# Patient Record
Sex: Male | Born: 1985 | Race: White | Hispanic: No | Marital: Married | Smoking: Former smoker
Health system: Southern US, Community
[De-identification: ages and names within clinical notes are randomized; demographics above are authoritative.]

## PROBLEM LIST (undated history)

## (undated) DIAGNOSIS — S060X9A Concussion with loss of consciousness of unspecified duration, initial encounter: Secondary | ICD-10-CM

## (undated) DIAGNOSIS — S060XAA Concussion with loss of consciousness status unknown, initial encounter: Secondary | ICD-10-CM

## (undated) DIAGNOSIS — F909 Attention-deficit hyperactivity disorder, unspecified type: Secondary | ICD-10-CM

## (undated) HISTORY — PX: KNEE SURGERY: SHX244

## (undated) HISTORY — DX: Concussion with loss of consciousness status unknown, initial encounter: S06.0XAA

## (undated) HISTORY — DX: Attention-deficit hyperactivity disorder, unspecified type: F90.9

## (undated) HISTORY — DX: Concussion with loss of consciousness of unspecified duration, initial encounter: S06.0X9A

---

## 2019-06-26 ENCOUNTER — Ambulatory Visit: Payer: Self-pay | Attending: Internal Medicine

## 2019-06-26 DIAGNOSIS — Z23 Encounter for immunization: Secondary | ICD-10-CM

## 2019-06-26 NOTE — Progress Notes (Signed)
   Covid-19 Vaccination Clinic  Name:  Scott Frederick    MRN: [id number] DOB: 11/04/1985  06/26/2019  Scott Frederick was observed post Covid-19 immunization for 15 minutes without incident. He was provided with Vaccine Information Sheet and instruction to access the V-Safe system.   Scott Frederick was instructed to call 911 with any severe reactions post vaccine: Marland Kitchen Difficulty breathing  . Swelling of face and throat  . A fast heartbeat  . A bad rash all over body  . Dizziness and weakness   Immunizations Administered    Name Date Dose VIS Date Route   Pfizer COVID-19 Vaccine 06/26/2019  3:07 PM 0.3 mL 02/20/2019 Intramuscular   Manufacturer: [employer], [employer]   Lot: [id number]   NDC: [address]-4

## 2019-07-20 ENCOUNTER — Ambulatory Visit: Payer: Self-pay | Attending: Internal Medicine

## 2019-07-21 ENCOUNTER — Ambulatory Visit: Payer: Self-pay | Attending: Internal Medicine

## 2019-07-21 DIAGNOSIS — Z23 Encounter for immunization: Secondary | ICD-10-CM

## 2019-07-21 NOTE — Progress Notes (Signed)
   Covid-19 Vaccination Clinic  Name:  Scott Frederick    MRN: [id number] DOB: [birth year]  07/21/2019  Scott Frederick was observed post Covid-19 immunization for 15 minutes without incident. He was provided with Vaccine Information Sheet and instruction to access the V-Safe system.   Scott Frederick was instructed to call 911 with any severe reactions post vaccine: Marland Kitchen Difficulty breathing  . Swelling of face and throat  . A fast heartbeat  . A bad rash all over body  . Dizziness and weakness   Immunizations Administered    Name Date Dose VIS Date Route   Pfizer COVID-19 Vaccine 07/21/2019  4:00 PM 0.3 mL 05/06/2018 Intramuscular   Manufacturer: [employer], [employer]   Lot: [id number]   NDC: [address]-8

## 2020-01-03 ENCOUNTER — Other Ambulatory Visit: Payer: Self-pay

## 2020-01-03 ENCOUNTER — Emergency Department (HOSPITAL_COMMUNITY)
Admission: EM | Admit: 2020-01-03 | Discharge: 2020-01-03 | Disposition: A | Discharge status: Home / Self Care | Payer: BC Managed Care – PPO | Attending: Emergency Medicine | Admitting: Emergency Medicine

## 2020-01-03 ENCOUNTER — Emergency Department (HOSPITAL_COMMUNITY): Payer: BC Managed Care – PPO

## 2020-01-03 ENCOUNTER — Encounter (HOSPITAL_COMMUNITY): Payer: Self-pay | Admitting: Emergency Medicine

## 2020-01-03 DIAGNOSIS — R9082 White matter disease, unspecified: Secondary | ICD-10-CM | POA: Diagnosis not present

## 2020-01-03 DIAGNOSIS — R202 Paresthesia of skin: Secondary | ICD-10-CM | POA: Insufficient documentation

## 2020-01-03 DIAGNOSIS — R200 Anesthesia of skin: Secondary | ICD-10-CM

## 2020-01-03 LAB — COMPREHENSIVE METABOLIC PANEL
ALT: 25 U/L (ref 0–44)
AST: 21 U/L (ref 15–41)
Albumin: 4.5 g/dL (ref 3.5–5.0)
Alkaline Phosphatase: 66 U/L (ref 38–126)
Anion gap: 9 (ref 5–15)
BUN: 12 mg/dL (ref 6–20)
CO2: 28 mmol/L (ref 22–32)
Calcium: 9.7 mg/dL (ref 8.9–10.3)
Chloride: 103 mmol/L (ref 98–111)
Creatinine, Ser: 1.22 mg/dL (ref 0.61–1.24)
GFR, Estimated: >60 mL/min (ref >60)
Glucose, Bld: 170 mg/dL — ABNORMAL HIGH (ref 70–99)
Potassium: 3.9 mmol/L (ref 3.5–5.1)
Sodium: 140 mmol/L (ref 135–145)
Total Bilirubin: 1 mg/dL (ref 0.3–1.2)
Total Protein: 7.4 g/dL (ref 6.5–8.1)

## 2020-01-03 LAB — CBC WITH DIFFERENTIAL/PLATELET
Abs Immature Granulocytes: 0.02 10*3/uL (ref 0.00–0.07)
Basophils Absolute: 0 10*3/uL (ref 0.0–0.1)
Basophils Relative: 1 %
Eosinophils Absolute: 0.1 10*3/uL (ref 0.0–0.5)
Eosinophils Relative: 2 %
HCT: 48.2 % (ref 39.0–52.0)
Hemoglobin: 16.2 g/dL (ref 13.0–17.0)
Immature Granulocytes: 0 %
Lymphocytes Relative: 30 %
Lymphs Abs: 1.8 10*3/uL (ref 0.7–4.0)
MCH: 29.5 pg (ref 26.0–34.0)
MCHC: 33.6 g/dL (ref 30.0–36.0)
MCV: 87.6 fL (ref 80.0–100.0)
Monocytes Absolute: 0.3 10*3/uL (ref 0.1–1.0)
Monocytes Relative: 5 %
Neutro Abs: 3.8 10*3/uL (ref 1.7–7.7)
Neutrophils Relative %: 62 %
Platelets: 207 10*3/uL (ref 150–400)
RBC: 5.5 MIL/uL (ref 4.22–5.81)
RDW: 11.9 % (ref 11.5–15.5)
WBC: 6 10*3/uL (ref 4.0–10.5)
nRBC: 0 % (ref 0.0–0.2)

## 2020-01-03 LAB — TSH: TSH: 1.058 u[IU]/mL (ref 0.350–4.500)

## 2020-01-03 LAB — MAGNESIUM: Magnesium: 2 mg/dL (ref 1.7–2.4)

## 2020-01-03 LAB — CK: Total CK: 95 U/L (ref 49–397)

## 2020-01-03 MED ORDER — GADOBUTROL 1 MMOL/ML IV SOLN
10.0000 mL | Freq: Once | INTRAVENOUS | Status: AC | PRN
  Administered 2020-01-03: 10 mL via INTRAVENOUS

## 2020-01-03 NOTE — Discharge Instructions (Signed)
You were seen in the emergency room today with intermittent numbness in the left arm and bilateral legs.  Really did lab work and an MRI of the brain which did not show any active lesions that would point to any specific cause of your symptoms.  I have placed a referral to a neurologist and would like for you to call the office tomorrow to arrange the first available follow-up appointment.  Please also call your primary care doctor to schedule a follow-up appointment.  Return to the emergency department any new or suddenly worsening symptoms such as chest pain, worsening numbness, weakness, vision changes, sudden severe headache, or speech changes.

## 2020-01-03 NOTE — ED Notes (Signed)
Pt returns from MRI. Pt has complaints at this time.

## 2020-01-03 NOTE — ED Triage Notes (Signed)
Pt. Stated, this numbness in my left arm and both legs feel like Ive been laying on them, with some occassionally pain. This all started on Friday.

## 2020-01-03 NOTE — ED Provider Notes (Signed)
Emergency Department Provider Note   I have reviewed the triage vital signs and the nursing notes.   HISTORY  Chief Complaint Arm Pain and Leg Pain   HPI Scott Frederick is a 34 y.o. male with no known past medical history presents to the emergency department with intermittent numbness feeling in the extremities.  Patient states that several weeks ago he had numbness in his bilateral arms along with some chest heaviness.  He went to an outside emergency department and had a cardiac work-up which was negative.  Symptoms resolved and had not returned until 2 days ago when he began noticing some numb type feeling in his left upper extremity.  He states that he intermittently has a numbness feeling in the bilateral legs which seems localized in the calfs and thighs but somewhat difficult to fully localize.  He does occasionally have numb feeling in the right arm.  The left arm numbness has been more persistent.  He denies vision change, headache, neck or back pain, speech difficulty, difficulty swallowing.  He does report a family history of factor V Leiden but this was a recent diagnosis for his father and he himself has not been tested.  He is not experiencing any chest pain with this episode, palpitations, shortness of breath.  No upper respiratory infection symptoms. No specific weakness. He drinks EtOH occasionally and denies drug use.    History reviewed. No pertinent past medical history.  There are no problems to display for this patient.   History reviewed. No pertinent surgical history.  Allergies Patient has no allergy information on record.  No family history on file.  Social History Social History   Tobacco Use  . Smoking status: Not on file  Substance Use Topics  . Alcohol use: Not on file  . Drug use: Not on file    Review of Systems  Constitutional: No fever/chills Eyes: No visual changes. ENT: No sore throat. Cardiovascular: Denies chest pain. Respiratory:  Denies shortness of breath. Gastrointestinal: No abdominal pain.  No nausea, no vomiting.  No diarrhea.  No constipation. Genitourinary: Negative for dysuria. Musculoskeletal: Negative for back pain. Skin: Negative for rash. Neurological: Negative for headaches or weakness. Positive numbness in the extremities.   10-point ROS otherwise negative.  ____________________________________________   PHYSICAL EXAM:  VITAL SIGNS: ED Triage Vitals  Enc Vitals Group     BP 01/03/20 1049 (!) 156/87     Pulse Rate 01/03/20 1049 (!) 106     Resp 01/03/20 1049 17     Temp 01/03/20 1049 98.7 F (37.1 C)     Temp Source 01/03/20 1049 Oral     SpO2 01/03/20 1049 100 %   Constitutional: Alert and oriented. Well appearing and in no acute distress. Eyes: Conjunctivae are normal. PERRL. EOMI. Head: Atraumatic. Nose: No congestion/rhinnorhea. Mouth/Throat: Mucous membranes are moist.  Neck: No stridor.   Cardiovascular: Tachycardia. Good peripheral circulation. Grossly normal heart sounds.   Respiratory: Normal respiratory effort.  No retractions. Lungs CTAB. Gastrointestinal: Soft and nontender. No distention.  Musculoskeletal: No lower extremity tenderness nor edema. No gross deformities of extremities. Neurologic:  Normal speech and language.  No facial asymmetry.  Normal sensation in the bilateral face.  Subjective decreased sensation to light touch in the left arm but preserved 5/5 strength in the bilateral upper and lower extremities with no drift.  No sensory deficit in the bilateral lower extremities appreciated.  Skin:  Skin is warm, dry and intact. No rash noted.  ____________________________________________  LABS (all labs ordered are listed, but only abnormal results are displayed)  Labs Reviewed  COMPREHENSIVE METABOLIC PANEL - Abnormal; Notable for the following components:      Result Value   Glucose, Bld 170 (*)    All other components within normal limits  CBC WITH  DIFFERENTIAL/PLATELET  CK  MAGNESIUM  TSH   ____________________________________________  EKG   EKG Interpretation  Date/Time:  Sunday January 03 2020 11:34:27 EDT Ventricular Rate:  97 PR Interval:    QRS Duration: 90 QT Interval:  322 QTC Calculation: 409 R Axis:   84 Text Interpretation: Sinus rhythm Consider anterior infarct No STEMI Confirmed by Alona Bene [phone]) on 01/03/2020 11:43:22 AM       ____________________________________________  RADIOLOGY  MR Brain W and Wo Contrast  Result Date: 01/03/2020 CLINICAL DATA:  "Multiple sclerosis, new event." Numbness in the left arm and both legs with occasional pain that started on Friday. EXAM: MRI HEAD WITHOUT AND WITH CONTRAST TECHNIQUE: Multiplanar, multiecho pulse sequences of the brain and surrounding structures were obtained without and with intravenous contrast. CONTRAST:  69mL GADAVIST GADOBUTROL 1 MMOL/ML IV SOLN COMPARISON:  None. FINDINGS: Brain: No acute infarction, hemorrhage, hydrocephalus, extra-axial collection or mass lesion. No white matter disease. Incidental mega cisterna magna. Vascular: Normal flow voids and vascular enhancements Skull and upper cervical spine: Normal marrow signal Sinuses/Orbits: Minor mucosal thickening that is generalized in the paranasal sinuses. IMPRESSION: Normal MRI of the brain. Electronically Signed   By: Marnee Spring M.D.   On: 01/03/2020 13:45    ____________________________________________   PROCEDURES  Procedure(s) performed:   Procedures  None  ____________________________________________   INITIAL IMPRESSION / ASSESSMENT AND PLAN / ED COURSE  Pertinent labs & imaging results that were available during my care of the patient were reviewed by me and considered in my medical decision making (see chart for details).   Patient presents to the emergency department for evaluation of intermittent numbness in the extremities.  Numbness is most persistent in the left  upper extremity and there is a sensory difference in the right and left arms on my exam.  No weakness, cranial nerve findings.  Patient's not having headache to suspect complex migraine.  No neck or back pain to suspect more radicular type pathology.  MS is a consideration.  Patient is low risk for stroke.  Have added on TSH, mag, CK in addition to basic lab work.  He is not having chest pain to suspect acute ACS or PE. Discussed case with Neurology on call who agree with MRI brain w and w/o contrast to r/o CVA vs MS lesions.   01:50 PM  MRI brain is negative for CVA or other lesions to explain symptoms. Have placed a Neurology referral in Epic and advised close PCP and Neuro follow up. Discussed ED return precautions. Labs reviewed and unremarkable.    ____________________________________________  FINAL CLINICAL IMPRESSION(S) / ED DIAGNOSES  Final diagnoses:  Numbness and tingling in left arm  Numbness and tingling of both legs    MEDICATIONS GIVEN DURING THIS VISIT:  Medications  gadobutrol (GADAVIST) 1 MMOL/ML injection 10 mL (10 mLs Intravenous Contrast Given 01/03/20 1300)    Note:  This document was prepared using Dragon voice recognition software and may include unintentional dictation errors.  Alona Bene, MD, [facility]    Chaniyah Jahr, Arlyss Repress, MD 01/03/20 1356

## 2020-01-03 NOTE — ED Notes (Signed)
Patient transported to MRI 
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
## 2020-01-06 ENCOUNTER — Ambulatory Visit: Payer: BC Managed Care – PPO | Admitting: Neurology

## 2020-01-06 ENCOUNTER — Other Ambulatory Visit: Payer: Self-pay

## 2020-01-06 ENCOUNTER — Encounter: Payer: Self-pay | Admitting: Neurology

## 2020-01-06 VITALS — BP 150/92 | HR 64 | Ht 71.0 in | Wt 243.0 lb

## 2020-01-06 DIAGNOSIS — R202 Paresthesia of skin: Secondary | ICD-10-CM

## 2020-01-06 DIAGNOSIS — R200 Anesthesia of skin: Secondary | ICD-10-CM

## 2020-01-06 NOTE — Patient Instructions (Addendum)
EMG/NCS MRI cervical spine   Carpal Tunnel Syndrome  Carpal tunnel syndrome is a condition that causes pain in your hand and arm. The carpal tunnel is a narrow area that is on the palm side of your wrist. Repeated wrist motion or certain diseases may cause swelling in the tunnel. This swelling can pinch the main nerve in the wrist (median nerve). What are the causes? This condition may be caused by:  Repeated wrist motions.  Wrist injuries.  Arthritis.  A sac of fluid (cyst) or abnormal growth (tumor) in the carpal tunnel.  Fluid buildup during pregnancy. Sometimes the cause is not known. What increases the risk? The following factors may make you more likely to develop this condition:  Having a job in which you move your wrist in the same way many times. This includes jobs like being a Midwife or a Conservation officer, nature.  Having other health conditions, such as: ? Diabetes. ? Obesity. ? A thyroid gland that is not active enough (hypothyroidism). ? Kidney failure. What are the signs or symptoms? Symptoms of this condition include:  A tingling feeling in your fingers.  Tingling or a loss of feeling (numbness) in your hand.  Pain in your entire arm. This pain may get worse when you bend your wrist and elbow for a long time.  Pain in your wrist that goes up your arm to your shoulder.  Pain that goes down into your palm or fingers.  A weak feeling in your hands. You may find it hard to grab and hold items. You may feel worse at night. How is this diagnosed? This condition is diagnosed with a medical history and physical exam. You may also have tests, such as:  Electromyogram (EMG). This test checks the signals that the nerves send to the muscles.  Nerve conduction study. This test checks how well signals pass through your nerves.  Imaging tests, such as X-rays, ultrasound, and MRI. These tests check for what might be the cause of your condition. How is this treated? This condition  may be treated with:  Lifestyle changes. You will be asked to stop or change the activity that caused your problem.  Doing exercise and activities that make bones and muscles stronger (physical therapy).  Learning how to use your hand again (occupational therapy).  Medicines for pain and swelling (inflammation). You may have injections in your wrist.  A wrist splint.  Surgery. Follow these instructions at home: If you have a splint:  Wear the splint as told by your doctor. Remove it only as told by your doctor.  Loosen the splint if your fingers: ? Tingle. ? Lose feeling (become numb). ? Turn cold and blue.  Keep the splint clean.  If the splint is not waterproof: ? Do not let it get wet. ? Cover it with a watertight covering when you take a bath or a shower. Managing pain, stiffness, and swelling   If told, put ice on the painful area: ? If you have a removable splint, remove it as told by your doctor. ? Put ice in a plastic bag. ? Place a towel between your skin and the bag. ? Leave the ice on for 20 minutes, 2-3 times per day. General instructions  Take over-the-counter and prescription medicines only as told by your doctor.  Rest your wrist from any activity that may cause pain. If needed, talk with your boss at work about changes that can help your wrist heal.  Do any exercises as told by  your doctor, physical therapist, or occupational therapist.  Keep all follow-up visits as told by your doctor. This is important. Contact a doctor if:  You have new symptoms.  Medicine does not help your pain.  Your symptoms get worse. Get help right away if:  You have very bad numbness or tingling in your wrist or hand. Summary  Carpal tunnel syndrome is a condition that causes pain in your hand and arm.  It is often caused by repeated wrist motions.  Lifestyle changes and medicines are used to treat this problem. Surgery may help in very bad cases.  Follow your  doctor's instructions about wearing a splint, resting your wrist, keeping follow-up visits, and calling for help. This information is not intended to replace advice given to you by your health care provider. Make sure you discuss any questions you have with your health care provider. Document Revised: 07/05/2017 Document Reviewed: 07/05/2017 Elsevier Patient Education  2020 [employer] Inc.   Electromyoneurogram Electromyoneurogram is a test to check how well your muscles and nerves are working. This procedure includes the combined use of electromyogram (EMG) and nerve conduction study (NCS). EMG is used to look for muscular disorders. NCS, which is also called electroneurogram, measures how well your nerves are controlling your muscles. The procedures are usually done together to check if your muscles and nerves are healthy. If the results of the tests are abnormal, this may indicate disease or injury, such as a neuromuscular disease or peripheral nerve damage. Tell a health care provider about:  Any allergies you have.  All medicines you are taking, including vitamins, herbs, eye drops, creams, and over-the-counter medicines.  Any problems you or family members have had with anesthetic medicines.  Any blood disorders you have.  Any surgeries you have had.  Any medical conditions you have.  If you have a pacemaker.  Whether you are pregnant or may be pregnant. What are the risks? Generally, this is a safe procedure. However, problems may occur, including:  Infection where the electrodes were inserted.  Bleeding. What happens before the procedure? Medicines Ask your health care provider about:  Changing or stopping your regular medicines. This is especially important if you are taking diabetes medicines or blood thinners.  Taking medicines such as aspirin and ibuprofen. These medicines can thin your blood. Do not take these medicines unless your health care provider tells you to  take them.  Taking over-the-counter medicines, vitamins, herbs, and supplements. General instructions  Your health care provider may ask you to avoid: ? Beverages that have caffeine, such as coffee and tea. ? Any products that contain nicotine or tobacco. These products include cigarettes, e-cigarettes, and chewing tobacco. If you need help quitting, ask your health care provider.  Do not use lotions or creams on the same day that you will be having the procedure. What happens during the procedure? For EMG   Your health care provider will ask you to stay in a position so that he or she can access the muscle that will be studied. You may be standing, sitting, or lying down.  You may be given a medicine that numbs the area (local anesthetic).  A very thin needle that has an electrode will be inserted into your muscle.  Another small electrode will be placed on your skin near the muscle.  Your health care provider will ask you to continue to remain still.  The electrodes will send a signal that tells about the electrical activity of your muscles. You  may see this on a monitor or hear it in the room.  After your muscles have been studied at rest, your health care provider will ask you to contract or flex your muscles. The electrodes will send a signal that tells about the electrical activity of your muscles.  Your health care provider will remove the electrodes and the electrode needles when the procedure is finished. The procedure may vary among health care providers and hospitals. For NCS   An electrode that records your nerve activity (recording electrode) will be placed on your skin by the muscle that is being studied.  An electrode that is used as a reference (reference electrode) will be placed near the recording electrode.  A paste or gel will be applied to your skin between the recording electrode and the reference electrode.  Your nerve will be stimulated with a mild  shock. Your health care provider will measure how much time it takes for your muscle to react.  Your health care provider will remove the electrodes and the gel when the procedure is finished. The procedure may vary among health care providers and hospitals. What happens after the procedure?  It is up to you to get the results of your procedure. Ask your health care provider, or the department that is doing the procedure, when your results will be ready.  Your health care provider may: ? Give you medicines for any pain. ? Monitor the insertion sites to make sure that bleeding stops. Summary  Electromyoneurogram is a test to check how well your muscles and nerves are working.  If the results of the tests are abnormal, this may indicate disease or injury.  This is a safe procedure. However, problems may occur, such as bleeding and infection.  Your health care provider will do two tests to complete this procedure. One checks your muscles (EMG) and another checks your nerves (NCS).  It is up to you to get the results of your procedure. Ask your health care provider, or the department that is doing the procedure, when your results will be ready. This information is not intended to replace advice given to you by your health care provider. Make sure you discuss any questions you have with your health care provider. Document Revised: 11/12/2017 Document Reviewed: 10/25/2017 Elsevier Patient Education  2020 [employer].

## 2020-01-07 ENCOUNTER — Encounter: Payer: Self-pay | Admitting: Neurology

## 2020-01-07 DIAGNOSIS — R200 Anesthesia of skin: Secondary | ICD-10-CM | POA: Insufficient documentation

## 2020-01-07 DIAGNOSIS — R202 Paresthesia of skin: Secondary | ICD-10-CM | POA: Insufficient documentation

## 2020-01-11 ENCOUNTER — Telehealth: Payer: Self-pay | Admitting: Neurology

## 2020-01-11 NOTE — Telephone Encounter (Signed)
LVM for pt to call back about scheduling mri  [employer] auth: [id number] (exp. 01/07/20 to 02/05/20)

## 2020-01-11 NOTE — Telephone Encounter (Signed)
Art from [facility] called to request MRI order for pt. He is requesting a call back.  Phone number: [phone]  Ref. # for auth. [id number]

## 2020-01-12 NOTE — Telephone Encounter (Signed)
I called [employer] and they informed me that the patient would like to go to [facility]. Once the order has been signed I will fax the order to [phone]. They will reach out to the patient to schedule.

## 2020-01-13 ENCOUNTER — Telehealth: Payer: Self-pay | Admitting: Neurology

## 2020-01-13 NOTE — Telephone Encounter (Signed)
I returned Scott Frederick's call @ [facility] and I advised per Dr Lucia Gaskins that she would like the MRI with and without contrast because she is also ruling out other disorders by evaluating for lesions of the spinal cord. Scott Frederick verbalized understanding and stated she would leave the order as is.

## 2020-01-13 NOTE — Telephone Encounter (Signed)
I would like it with AND without because I am also evaluating for lesions of the spinal cord such as multiple sclerosis or transverse myelitis

## 2020-01-13 NOTE — Telephone Encounter (Signed)
Arlene from [facility] called needing clarification on the MRI order that was sent for the pt. They need to know if it is to be either with or without the contrast. Please advise.

## 2020-01-25 ENCOUNTER — Telehealth: Payer: Self-pay | Admitting: Neurology

## 2020-01-25 NOTE — Telephone Encounter (Signed)
I called the pt and LVM (ok per DPR) advising per Dr. Lucia Gaskins that his MRI cervical showed some mild arthritic changes but no pinched nerves. The cervical spinal cord looks healthy. She did not see any cause for his symptoms. Dr Lucia Gaskins recommends he proceed with the EMG/NCS that he already has scheduled on 02/08/20 @ 2:45 pm. I left the office number for call back if he has any questions.   If he calls back and did not receive the VM, it is ok to give him the message above, thanks. Let us know if he has any questions.

## 2020-01-25 NOTE — Telephone Encounter (Signed)
MRI of the cervical spine showed some mild arthritic changes but no pinched nerves and a healthy cervical cord. No causes for his symptoms seen. I recommend he have the EMG/NCS on the 29th of this month.

## 2020-02-07 NOTE — Progress Notes (Signed)
Full Name: Shmiel Morton Gender: Male MRN #: [id number] Date of Birth: January 17, 1986    Visit Date: 02/08/2020 14:40 Age: 34 Years Examining Physician: Naomie Dean, MD  Referring Physician: Maia Plan, MD    History: Arm numbness and tingling, bilaterally.  MRI of the brain with and without contrast was normal.  MRI of the cervical spine with and without contrast showed some mild arthritic changes but no pinched nerves and normal spinal cord.  No cause for his symptoms thus far seen.  Summary: EMG/NCS performed on the upper extremities. All nerves and muscles (as indicated in the following tables) were within normal limits.    Conclusion: This is a normal study. No electrophysiologic evidence for carpal tunnel syndrome or other mononeuropathy, polyneuropathy, cervical radiculopathy or muscle disorder.  ------------------------------- Naomie Dean, M.D.  [facility] [address] [place], [place] [address] Tel: [phone] Fax: [phone]  Verbal informed consent was obtained from the patient, patient was informed of potential risk of procedure, including bruising, bleeding, hematoma formation, infection, muscle weakness, muscle pain, numbness, among others.     Cc: Dr. Jacqulyn Bath    [facility]    Nerve / Sites Muscle Latency Ref. Amplitude Ref. Rel Amp Segments Distance Velocity Ref. Area    ms ms mV mV %  cm m/s m/s mVms  R Median - APB     Wrist APB 3.7 ?4.4 6.6 ?4.0 100 Wrist - APB 7   31.3     Upper arm APB 8.0  6.4  96.8 Upper arm - Wrist 24 56 ?49 29.7  L Median - APB     Wrist APB 3.5 ?4.4 6.5 ?4.0 100 Wrist - APB 7   37.7     Upper arm APB 7.8  6.5  99.6 Upper arm - Wrist 24 56 ?49 37.1  R Ulnar - ADM     Wrist ADM 3.2 ?3.3 10.2 ?6.0 100 Wrist - ADM 7   39.2     B.Elbow ADM 6.8  9.9  96.9 B.Elbow - Wrist 22 61 ?49 37.6     A.Elbow ADM 8.5  9.8  98.4 A.Elbow - B.Elbow 10 61 ?49 39.1         A.Elbow - Wrist      L Ulnar - ADM     Wrist ADM 3.2 ?3.3  8.4 ?6.0 100 Wrist - ADM 7   36.3     B.Elbow ADM 6.7  9.1  108 B.Elbow - Wrist 22 62 ?49 37.0     A.Elbow ADM 8.4  9.0  98.7 A.Elbow - B.Elbow 10 62 ?49 37.0         A.Elbow - Wrist                 SNC    Nerve / Sites Rec. Site Peak Lat Ref.  Amp Ref. Segments Distance Peak Diff Ref.    ms ms V V  cm ms ms  R Median, Ulnar - Transcarpal comparison     Median Palm Wrist 2.2 ?2.2 109 ?35 Median Palm - Wrist 8       Ulnar Palm Wrist 2.0 ?2.2 24 ?12 Ulnar Palm - Wrist 8          Median Palm - Ulnar Palm  0.2 ?0.4  L Median, Ulnar - Transcarpal comparison     Median Palm Wrist 2.2 ?2.2 124 ?35 Median Palm - Wrist 8       Ulnar Palm Wrist 2.0 ?2.2  23 ?12 Ulnar Palm - Wrist 8          Median Palm - Ulnar Palm  0.2 ?0.4  R Median - Orthodromic (Dig II, Mid palm)     Dig II Wrist 3.3 ?3.4 22 ?10 Dig II - Wrist 13    L Median - Orthodromic (Dig II, Mid palm)     Dig II Wrist 3.3 ?3.4 23 ?10 Dig II - Wrist 13    R Ulnar - Orthodromic, (Dig V, Mid palm)     Dig V Wrist 2.9 ?3.1 9 ?5 Dig V - Wrist 11    L Ulnar - Orthodromic, (Dig V, Mid palm)     Dig V Wrist 2.7 ?3.1 9 ?5 Dig V - Wrist 23                   F  Wave    Nerve F Lat Ref.   ms ms  R Ulnar - ADM 28.8 ?32.0  L Ulnar - ADM 29.6 ?32.0         EMG Summary Table    Spontaneous MUAP Recruitment  Muscle IA Fib PSW Fasc Other Amp Dur. Poly Pattern  L. Deltoid Normal None None None _______ Normal Normal Normal Normal  L. Triceps brachii Normal None None None _______ Normal Normal Normal Normal  L. Pronator teres Normal None None None _______ Normal Normal Normal Normal  L. First dorsal interosseous Normal None None None _______ Normal Normal Normal Normal  L. Opponens pollicis Normal None None None _______ Normal Normal Normal Normal

## 2020-02-08 ENCOUNTER — Ambulatory Visit: Payer: BC Managed Care – PPO | Admitting: Neurology

## 2020-02-08 ENCOUNTER — Ambulatory Visit (INDEPENDENT_AMBULATORY_CARE_PROVIDER_SITE_OTHER): Payer: BC Managed Care – PPO | Admitting: Neurology

## 2020-02-08 DIAGNOSIS — R200 Anesthesia of skin: Secondary | ICD-10-CM

## 2020-02-08 DIAGNOSIS — Z832 Family history of diseases of the blood and blood-forming organs and certain disorders involving the immune mechanism: Secondary | ICD-10-CM

## 2020-02-08 DIAGNOSIS — Z0289 Encounter for other administrative examinations: Secondary | ICD-10-CM

## 2020-02-09 NOTE — Progress Notes (Signed)
See procedure note.

## 2020-02-09 NOTE — Procedures (Signed)
Full Name: Scott Frederick Gender: Male MRN #: [id number] Date of Birth: [birth year]    Visit Date: 02/08/2020 14:40 Age: 34 Years Examining Physician: Naomie Dean, MD  Referring Physician: Maia Plan, MD    History: Arm numbness and tingling, bilaterally.  MRI of the brain with and without contrast was normal.  MRI of the cervical spine with and without contrast showed some mild arthritic changes but no pinched nerves and normal spinal cord.  No cause for his symptoms thus far seen.  Summary: EMG/NCS performed on the upper extremities. All nerves and muscles (as indicated in the following tables) were within normal limits.    Conclusion: This is a normal study. No electrophysiologic evidence for carpal tunnel syndrome or other mononeuropathy, polyneuropathy, cervical radiculopathy or muscle disorder.  ------------------------------- Naomie Dean, M.D.  [facility] [address] [place], [place] [address] Tel: [phone] Fax: [phone]  Verbal informed consent was obtained from the patient, patient was informed of potential risk of procedure, including bruising, bleeding, hematoma formation, infection, muscle weakness, muscle pain, numbness, among others.         MNC    Nerve / Sites Muscle Latency Ref. Amplitude Ref. Rel Amp Segments Distance Velocity Ref. Area    ms ms mV mV %  cm m/s m/s mVms  R Median - APB     Wrist APB 3.7 ?4.4 6.6 ?4.0 100 Wrist - APB 7   31.3     Upper arm APB 8.0  6.4  96.8 Upper arm - Wrist 24 56 ?49 29.7  L Median - APB     Wrist APB 3.5 ?4.4 6.5 ?4.0 100 Wrist - APB 7   37.7     Upper arm APB 7.8  6.5  99.6 Upper arm - Wrist 24 56 ?49 37.1  R Ulnar - ADM     Wrist ADM 3.2 ?3.3 10.2 ?6.0 100 Wrist - ADM 7   39.2     B.Elbow ADM 6.8  9.9  96.9 B.Elbow - Wrist 22 61 ?49 37.6     A.Elbow ADM 8.5  9.8  98.4 A.Elbow - B.Elbow 10 61 ?49 39.1         A.Elbow - Wrist      L Ulnar - ADM     Wrist ADM 3.2 ?3.3 8.4 ?6.0 100  Wrist - ADM 7   36.3     B.Elbow ADM 6.7  9.1  108 B.Elbow - Wrist 22 62 ?49 37.0     A.Elbow ADM 8.4  9.0  98.7 A.Elbow - B.Elbow 10 62 ?49 37.0         A.Elbow - Wrist                 SNC    Nerve / Sites Rec. Site Peak Lat Ref.  Amp Ref. Segments Distance Peak Diff Ref.    ms ms V V  cm ms ms  R Median, Ulnar - Transcarpal comparison     Median Palm Wrist 2.2 ?2.2 109 ?35 Median Palm - Wrist 8       Ulnar Palm Wrist 2.0 ?2.2 24 ?12 Ulnar Palm - Wrist 8          Median Palm - Ulnar Palm  0.2 ?0.4  L Median, Ulnar - Transcarpal comparison     Median Palm Wrist 2.2 ?2.2 124 ?35 Median Palm - Wrist 8       Ulnar Palm Wrist 2.0 ?2.2 23 ?12  Ulnar Palm - Wrist 8          Median Palm - Ulnar Palm  0.2 ?0.4  R Median - Orthodromic (Dig II, Mid palm)     Dig II Wrist 3.3 ?3.4 22 ?10 Dig II - Wrist 13    L Median - Orthodromic (Dig II, Mid palm)     Dig II Wrist 3.3 ?3.4 23 ?10 Dig II - Wrist 13    R Ulnar - Orthodromic, (Dig V, Mid palm)     Dig V Wrist 2.9 ?3.1 9 ?5 Dig V - Wrist 11    L Ulnar - Orthodromic, (Dig V, Mid palm)     Dig V Wrist 2.7 ?3.1 9 ?5 Dig V - Wrist 68                   F  Wave    Nerve F Lat Ref.   ms ms  R Ulnar - ADM 28.8 ?32.0  L Ulnar - ADM 29.6 ?32.0         EMG Summary Table    Spontaneous MUAP Recruitment  Muscle IA Fib PSW Fasc Other Amp Dur. Poly Pattern  L. Deltoid Normal None None None _______ Normal Normal Normal Normal  L. Triceps brachii Normal None None None _______ Normal Normal Normal Normal  L. Pronator teres Normal None None None _______ Normal Normal Normal Normal  L. First dorsal interosseous Normal None None None _______ Normal Normal Normal Normal  L. Opponens pollicis Normal None None None _______ Normal Normal Normal Normal

## 2020-02-12 LAB — FACTOR V LEIDEN

## 2020-02-15 ENCOUNTER — Telehealth: Payer: Self-pay | Admitting: *Deleted

## 2020-02-15 NOTE — Telephone Encounter (Signed)
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 Please print and send this result to patient and call him. I am not sure if there is any treatment, please ask him to discuss with his primary care physician. "The factor V Leiden mutation is present on one copy of the patient's factor V gene. Presence of the heterozygous  factor V Leiden mutation confers an approximate 5-fold increase in the risk of venous thrombosis. " thank you.

## 2020-02-15 NOTE — Telephone Encounter (Signed)
Called pt & LVM (ok per DPR) advising of lab results regarding one copy of mutant gene Factor V Leiden and he should discuss this with PCP, unsure if there is treatment but it should be discussed. Advised pt that we will print and mail him a copy but do need the pt to call back to confirm address. I provided the office number and hours in the message.

## 2020-02-17 NOTE — Telephone Encounter (Signed)
Spoke with patient. He verbalized understanding that he has one copy of mutant gene Factor V Leiden which increases his risk of venous thrombosis. Pt stated he is in the process of finding a PCP who will accept new patients. He will setup mychart so we can release the results to him. He verbalized appreciation for the call and was made aware to let us know if he cannot get mychart setup so we can mail the results to him.

## 2021-12-26 IMAGING — MR MR HEAD WO/W CM
13 of 17 series · 26 of 48 positions shown · IV contrast (gadavist)
Comparison: None.

CLINICAL DATA: "Multiple sclerosis, new event." Numbness in the
left arm and both legs with occasional pain that started on [REDACTED].

EXAM:
MRI HEAD WITHOUT AND WITH CONTRAST
TECHNIQUE: Multiplanar, multiecho pulse sequences of the brain and surrounding
structures were obtained without and with intravenous contrast.
CONTRAST:  10mL GADAVIST GADOBUTROL 1 MMOL/ML IV SOLN

[Series 3: FLAIR · sagittal · 5.0mm · 0.47mm/px · 2 of 25 slices shown (1 of 3)]
[im 1/25]
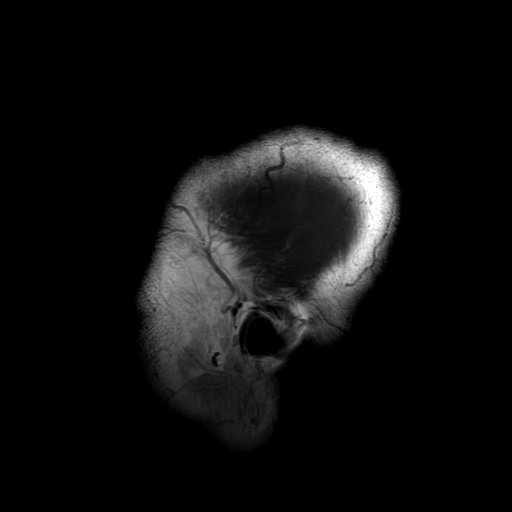
[im 25/25]
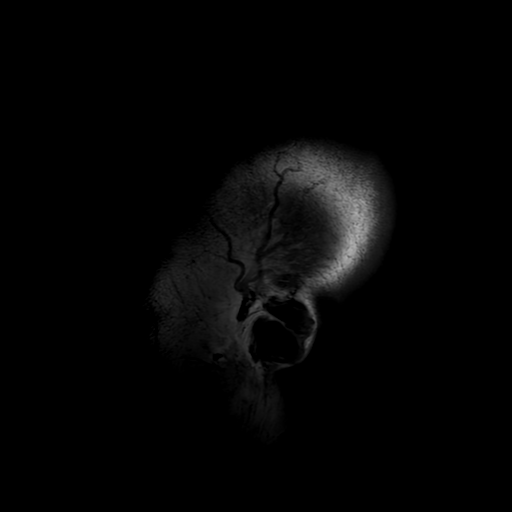

[Series 4: DWI · axial · 3.0mm · 0.94mm/px · z∈[-95,+54]mm · 3 of 106 slices shown (1 of 2)]
[im 1/106]
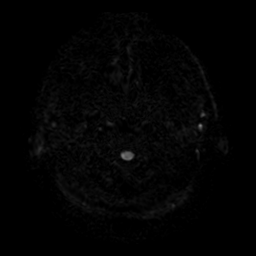
[im 53/106]
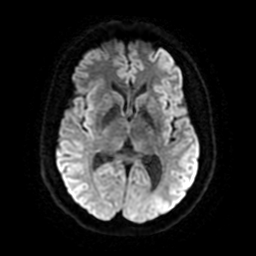
[im 106/106]
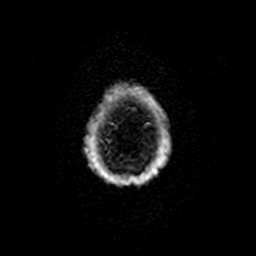

[Series 5: DWI · coronal · 4.0mm · 0.94mm/px · 2 of 74 slices shown (2 of 2)]
[im 1/74]
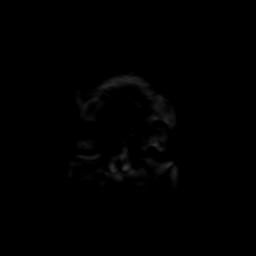
[im 74/74]
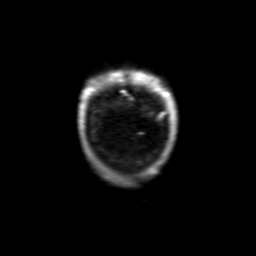

[Series 6: FLAIR · axial · 3.0mm · 0.45mm/px · 1 of 27 slices shown (2 of 3)]
[im 1/27]
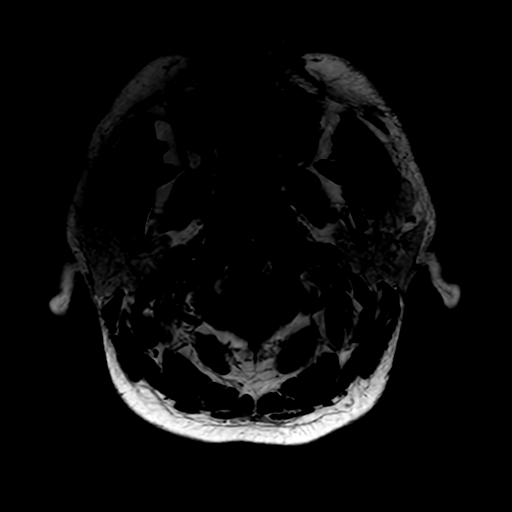

[Series 7: (person_name) · axial · 3.0mm · 0.47mm/px · z∈[-100,-23]mm · 2 of 108 slices shown]
[im 1/108]
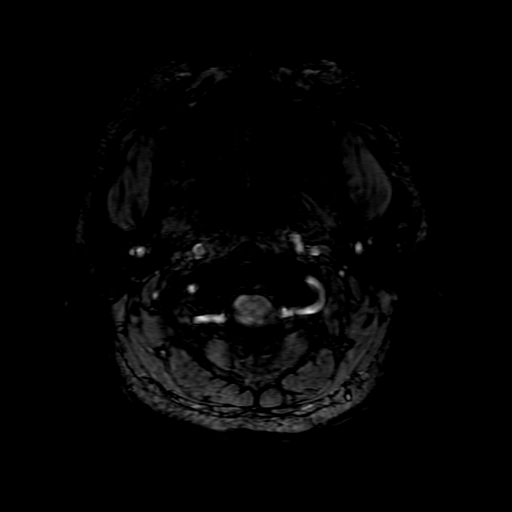
[im 54/108]
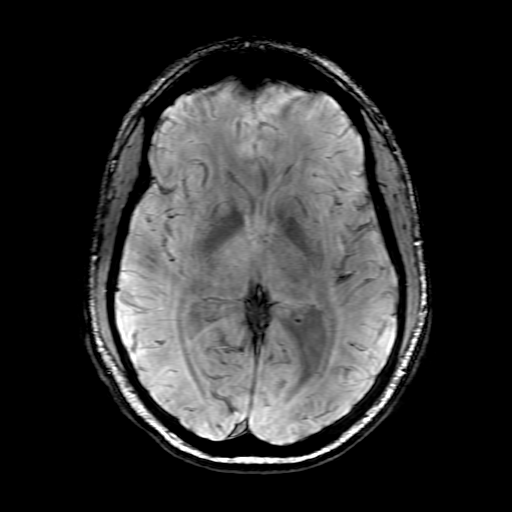

[Series 8: T2 · axial · 5.0mm · 0.47mm/px · 1 of 27 slices shown]
[im 1/27]
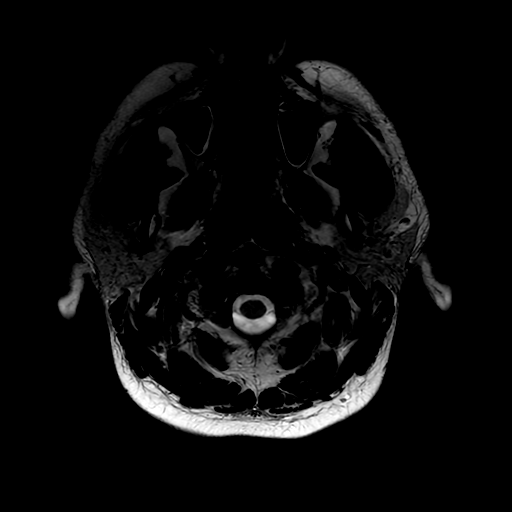

[Series 9: FLAIR · sagittal · 1.6mm · 0.49mm/px · 7 of 216 slices shown (3 of 3)]
[im 1/216]
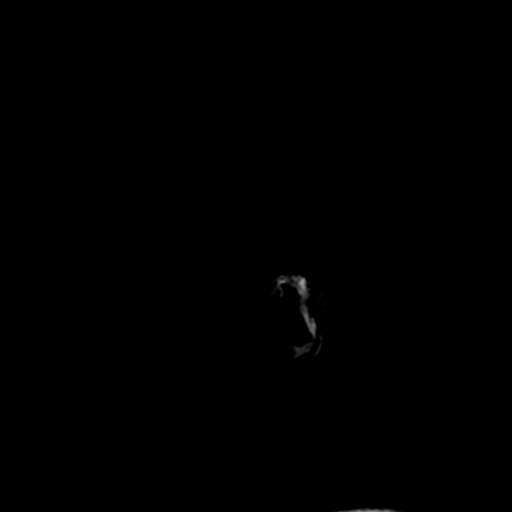
[im 36/216]
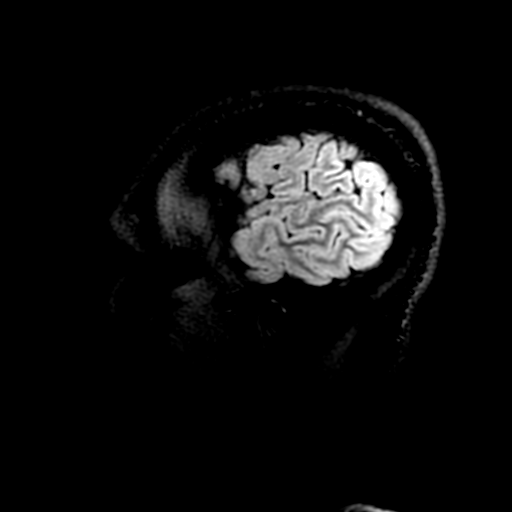
[im 72/216]
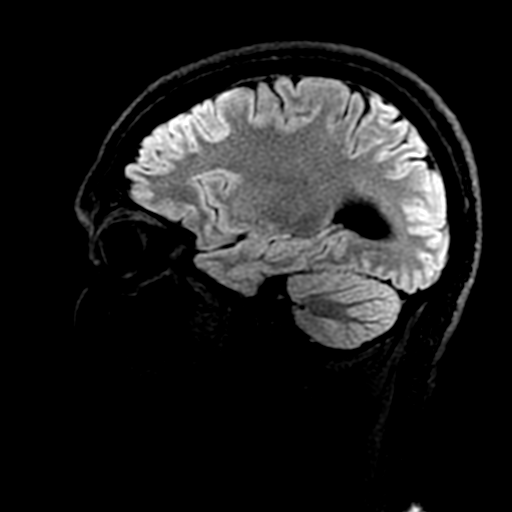
[im 108/216]
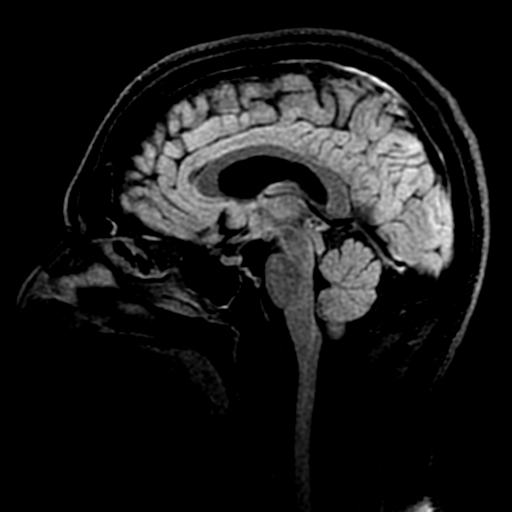
[im 144/216]
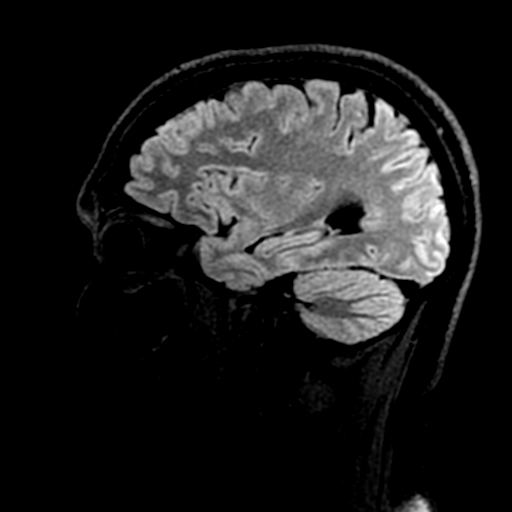
[im 180/216]
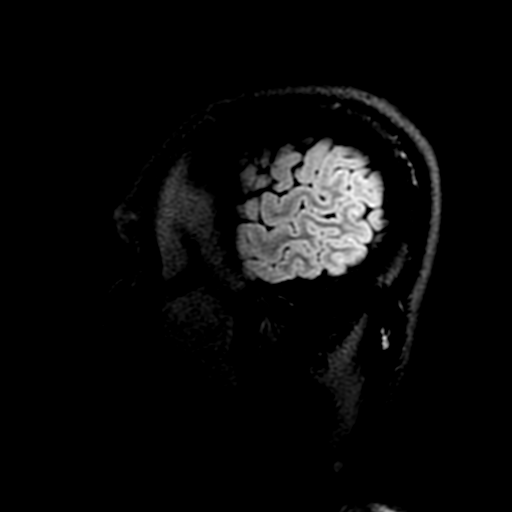
[im 216/216]
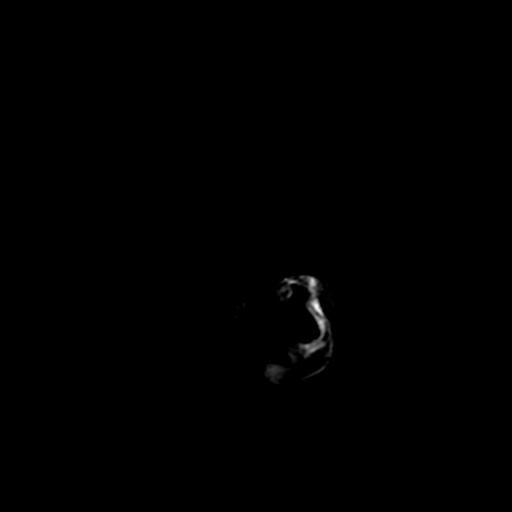

[Series 11: T2 post-contrast · coronal · 5.0mm · 0.41mm/px · 1 of 31 slices shown]
[im 1/31]
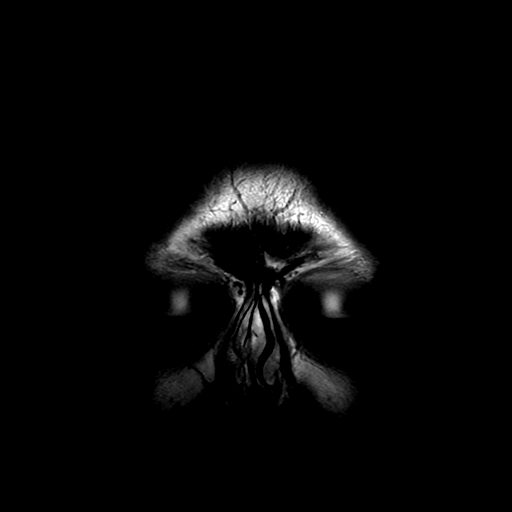

[Series 12: T1 · axial · 3.0mm · 0.94mm/px · z∈[-99,+53]mm · 2 of 54 slices shown (1 of 2)]
[im 1/54]
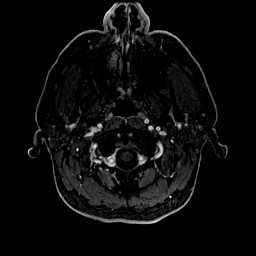
[im 54/54]
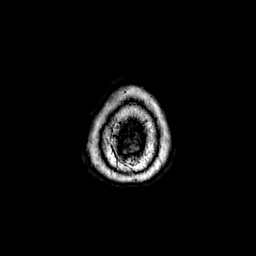

[Series 13: T1 · coronal · 5.0mm · 0.41mm/px · 1 of 31 slices shown (2 of 2)]
[im 1/31]
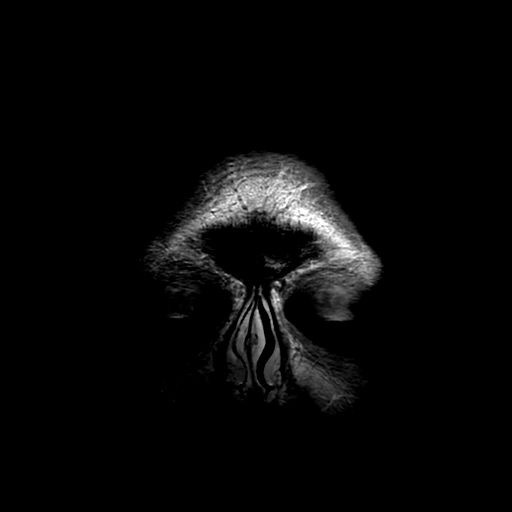

[Series 14: FLAIR post-contrast · sagittal · 5.0mm · 0.47mm/px · 1 of 25 slices shown]
[im 1/25]
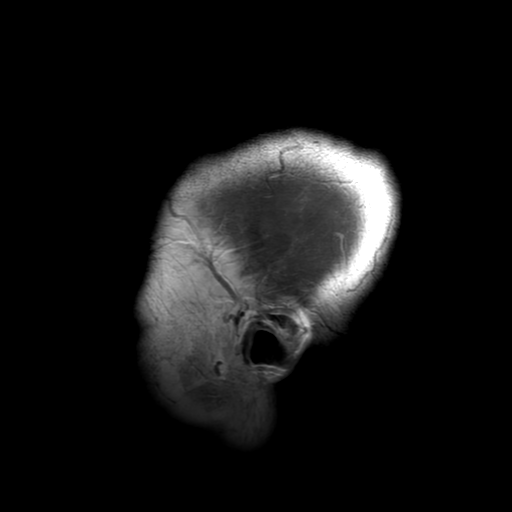

[Series 450: ADC · axial · 3.0mm · 0.94mm/px · z∈[-95,+54]mm · 2 of 53 slices shown (1 of 2)]
[im 1/53]
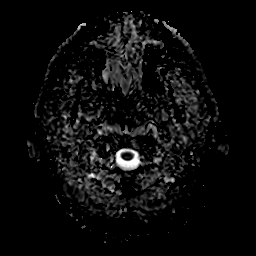
[im 53/53]
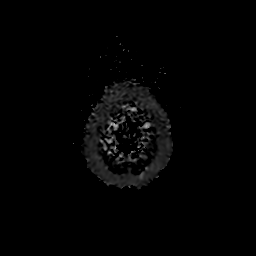

[Series 550: ADC · coronal · 4.0mm · 0.94mm/px · 1 of 37 slices shown (2 of 2)]
[im 1/37]
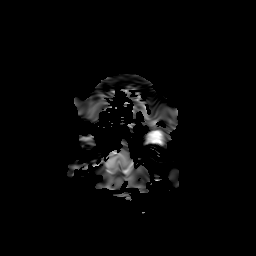

[26 of 48 positions shown; findings below may reference images not displayed]

FINDINGS: Brain: No acute infarction, hemorrhage, hydrocephalus, extra-axial
collection or mass lesion. No white matter disease. Incidental mega
cisterna magna.

Vascular: Normal flow voids and vascular enhancements

Skull and upper cervical spine: Normal marrow signal

Sinuses/Orbits: Minor mucosal thickening that is generalized in the
paranasal sinuses.
IMPRESSION: Normal MRI of the brain.
